# Patient Record
Sex: Male | Born: 1960 | Hispanic: Yes | Marital: Single | Smoking: Former smoker
Health system: Southern US, Community
[De-identification: ages and names within clinical notes are randomized; demographics above are authoritative.]

## PROBLEM LIST (undated history)

## (undated) DIAGNOSIS — I639 Cerebral infarction, unspecified: Secondary | ICD-10-CM

## (undated) DIAGNOSIS — E878 Other disorders of electrolyte and fluid balance, not elsewhere classified: Secondary | ICD-10-CM

## (undated) DIAGNOSIS — E119 Type 2 diabetes mellitus without complications: Secondary | ICD-10-CM

## (undated) DIAGNOSIS — I10 Essential (primary) hypertension: Secondary | ICD-10-CM

---

## 2006-06-07 ENCOUNTER — Emergency Department: Payer: Self-pay | Admitting: Emergency Medicine

## 2006-06-09 ENCOUNTER — Emergency Department: Payer: Self-pay | Admitting: Emergency Medicine

## 2012-04-07 ENCOUNTER — Inpatient Hospital Stay: Payer: Self-pay | Admitting: Specialist

## 2012-04-07 LAB — CBC
HGB: 14.8 g/dL (ref 13.0–18.0)
MCHC: 33.7 g/dL (ref 32.0–36.0)
MCV: 89 fL (ref 80–100)
Platelet: 179 10*3/uL (ref 150–440)
RBC: 4.96 10*6/uL (ref 4.40–5.90)
WBC: 16 10*3/uL — ABNORMAL HIGH (ref 3.8–10.6)

## 2012-04-07 LAB — LIPASE, BLOOD: Lipase: 3895 U/L — ABNORMAL HIGH (ref 73–393)

## 2012-04-07 LAB — COMPREHENSIVE METABOLIC PANEL
Albumin: 3 g/dL — ABNORMAL LOW (ref 3.4–5.0)
Anion Gap: 17 — ABNORMAL HIGH (ref 7–16)
BUN: 12 mg/dL (ref 7–18)
Calcium, Total: 7.4 mg/dL — ABNORMAL LOW (ref 8.5–10.1)
Chloride: 93 mmol/L — ABNORMAL LOW (ref 98–107)
Creatinine: 1.14 mg/dL (ref 0.60–1.30)
Osmolality: 268 (ref 275–301)
Potassium: 3.2 mmol/L — ABNORMAL LOW (ref 3.5–5.1)
SGPT (ALT): 262 U/L — ABNORMAL HIGH
Sodium: 129 mmol/L — ABNORMAL LOW (ref 136–145)

## 2012-04-07 LAB — APTT: Activated PTT: 23.1 secs — ABNORMAL LOW (ref 23.6–35.9)

## 2012-04-07 LAB — CK TOTAL AND CKMB (NOT AT ARMC)
CK, Total: 69 U/L (ref 35–232)
CK, Total: 61 U/L (ref 35–232)

## 2012-04-07 LAB — TROPONIN I: Troponin-I: 0.03 ng/mL

## 2012-04-08 LAB — CBC WITH DIFFERENTIAL/PLATELET
Eosinophil %: 0.6 %
HCT: 41.7 % (ref 40.0–52.0)
HGB: 14 g/dL (ref 13.0–18.0)
Lymphocyte #: 1.8 10*3/uL (ref 1.0–3.6)
Lymphocyte %: 10.2 %
MCHC: 33.5 g/dL (ref 32.0–36.0)
Monocyte #: 1.4 x10 3/mm — ABNORMAL HIGH (ref 0.2–1.0)
Neutrophil #: 14 10*3/uL — ABNORMAL HIGH (ref 1.4–6.5)
Neutrophil %: 80.6 %
WBC: 17.4 10*3/uL — ABNORMAL HIGH (ref 3.8–10.6)

## 2012-04-08 LAB — BASIC METABOLIC PANEL
Anion Gap: 10 (ref 7–16)
BUN: 9 mg/dL (ref 7–18)
Calcium, Total: 7.5 mg/dL — ABNORMAL LOW (ref 8.5–10.1)
Chloride: 91 mmol/L — ABNORMAL LOW (ref 98–107)
Co2: 27 mmol/L (ref 21–32)
Creatinine: 1.12 mg/dL (ref 0.60–1.30)
Glucose: 229 mg/dL — ABNORMAL HIGH (ref 65–99)
Osmolality: 263 (ref 275–301)
Potassium: 3 mmol/L — ABNORMAL LOW (ref 3.5–5.1)
Sodium: 128 mmol/L — ABNORMAL LOW (ref 136–145)

## 2012-04-08 LAB — LIPID PANEL
Cholesterol: 213 mg/dL — ABNORMAL HIGH (ref 0–200)
HDL Cholesterol: 12 mg/dL — ABNORMAL LOW (ref 40–60)

## 2012-04-08 LAB — HEMOGLOBIN A1C: Hemoglobin A1C: 8.7 % — ABNORMAL HIGH (ref 4.2–6.3)

## 2012-04-09 LAB — COMPREHENSIVE METABOLIC PANEL
Anion Gap: 12 (ref 7–16)
Calcium, Total: 8.2 mg/dL — ABNORMAL LOW (ref 8.5–10.1)
Co2: 23 mmol/L (ref 21–32)
EGFR (African American): >60
EGFR (Non-African Amer.): >60
Osmolality: 264 (ref 275–301)
SGOT(AST): 38 U/L — ABNORMAL HIGH (ref 15–37)
SGPT (ALT): 98 U/L — ABNORMAL HIGH
Sodium: 130 mmol/L — ABNORMAL LOW (ref 136–145)
Total Protein: 7.5 g/dL (ref 6.4–8.2)

## 2012-04-10 LAB — LIPASE, BLOOD: Lipase: 389 U/L (ref 73–393)

## 2013-12-19 IMAGING — CR DG CHEST 1V PORT
1 series · 1 of 1 positions shown · non-contrast
Comparison: none

REASON FOR EXAM: Chest Pain
COMMENTS:

PROCEDURE:     DXR - DXR PORTABLE CHEST SINGLE VIEW  - April 07, 2012  [DATE]
RESULT:     The lung fields are clear. The heart, mediastinal and osseous
structures show no significant abnormalities. Monitoring electrodes are
present.

[portable]
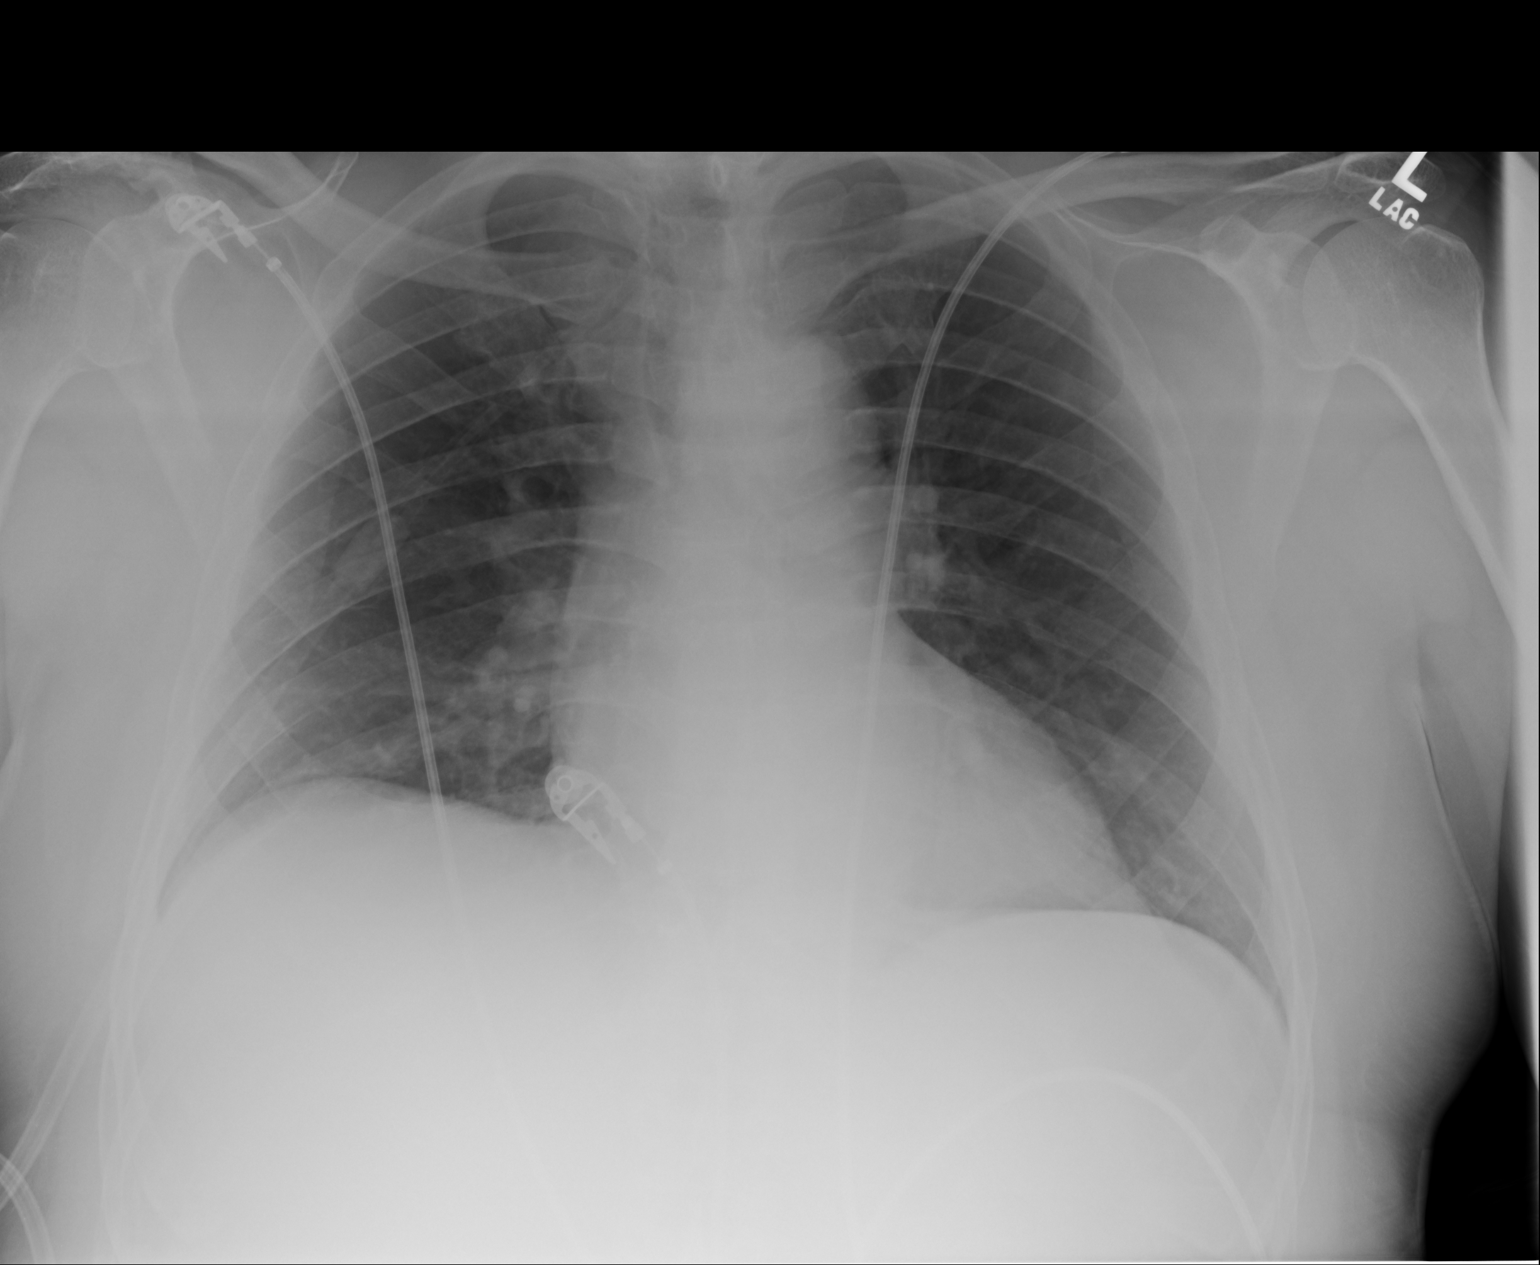

[1 of 1 positions shown; findings below may reference images not displayed]

IMPRESSION: 1.     No acute changes are identified.

[REDACTED]

## 2013-12-19 IMAGING — CT CT CHEST-ABD-PELV W/ CM
1 of 4 series · 11 of 34 positions shown, 17 images · IV contrast (APPLIED)
Comparison: None

REASON FOR EXAM: (1) chest paiin very hypertensive eval for dissection;
(2) left abd pain  IV  co
COMMENTS:

PROCEDURE:     CT  - CT CHEST ABDOMEN AND PELVIS W  - April 07, 2012  [DATE]
RESULT:     CT CHEST, ABDOMEN, AND PELVIS
TECHNIQUE: Multiple cardiac gated thin-section axial images obtained from
the thoracic inlet to the pubic symphysis, without p.o. contrast and with
100 ml of [id number] intravenous contrast. 3-D reconstructions and MIP
images were performed on a Siemens workstation.

[Series 6: soft tissue · axial · 0.75mm/px · z∈[-106,+218]mm · 11 of 130 slices shown, 17 images]
[im 11/130  mediastinal]
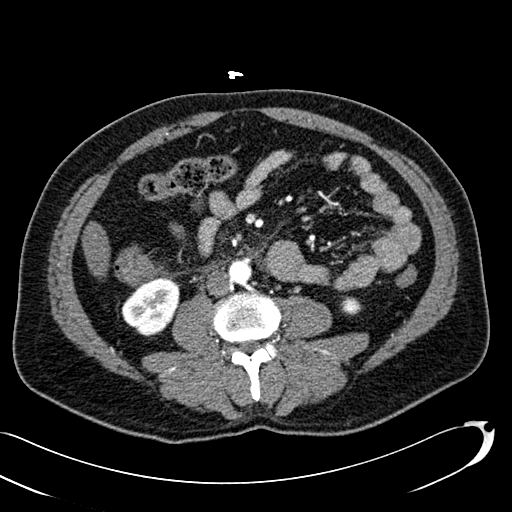
[im 11/130  bone]
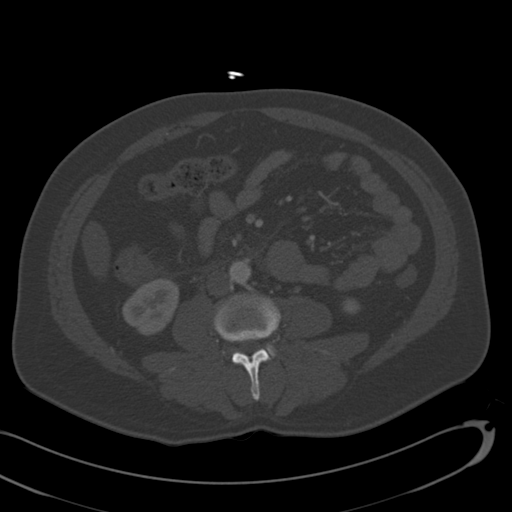
[im 22/130  mediastinal]
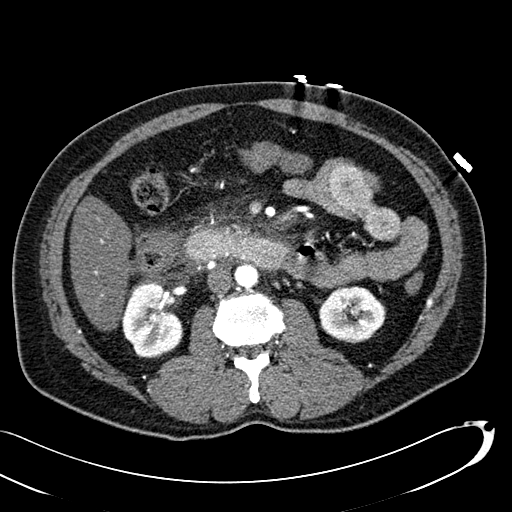
[im 33/130  mediastinal]
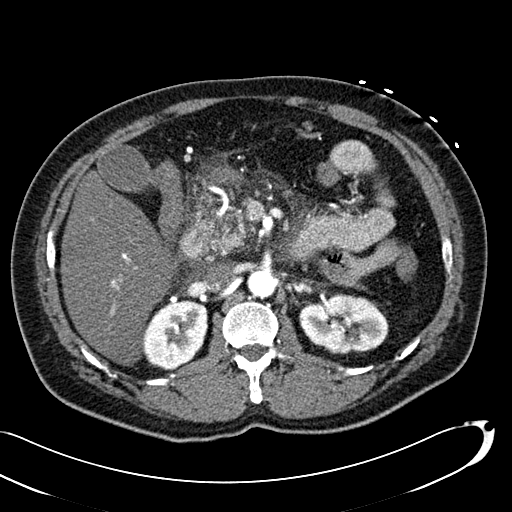
[im 44/130  mediastinal]
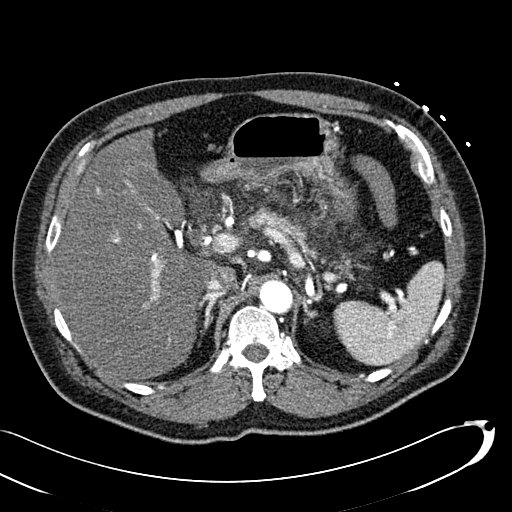
[im 54/130  mediastinal]
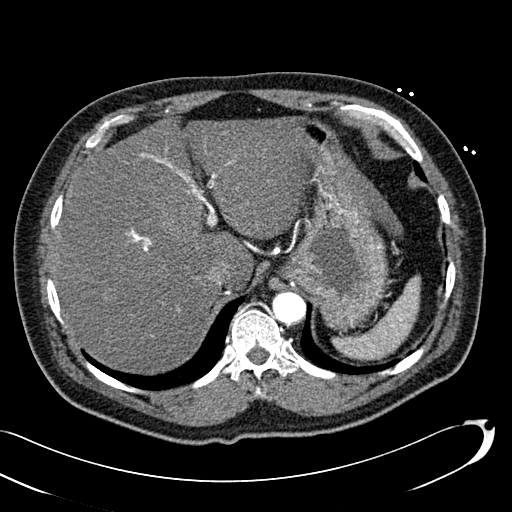
[im 65/130  mediastinal]
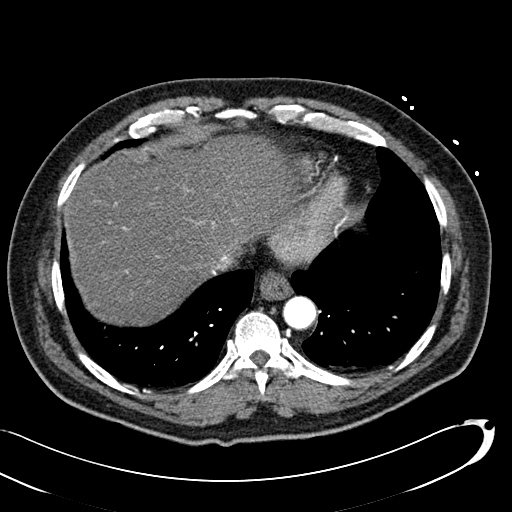
[im 76/130  mediastinal]
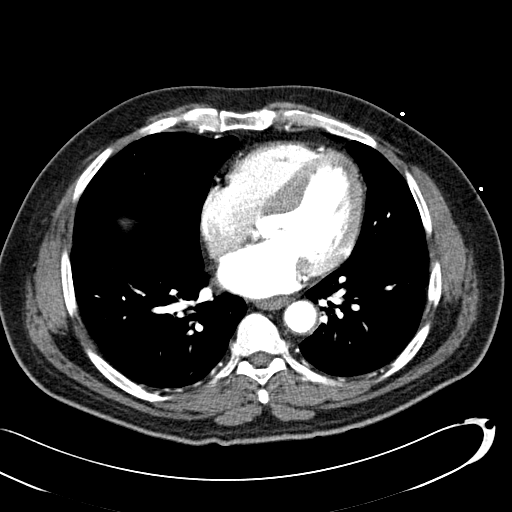
[im 87/130  mediastinal]
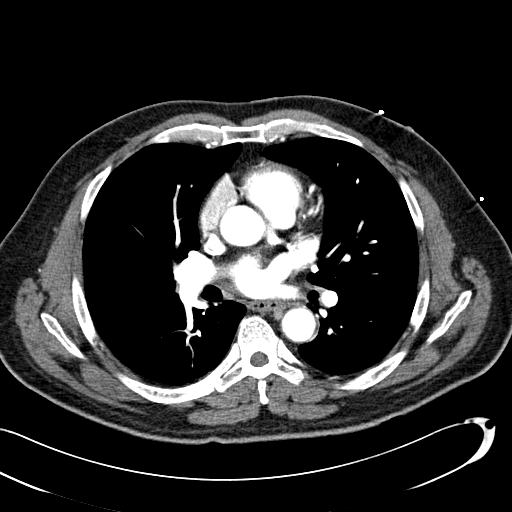
[im 87/130  lung]
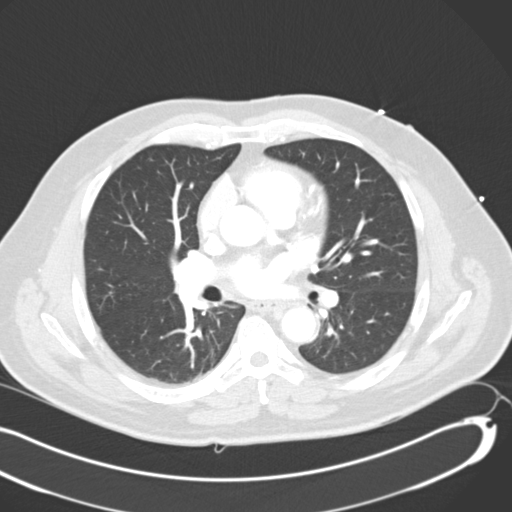
[im 97/130  mediastinal]
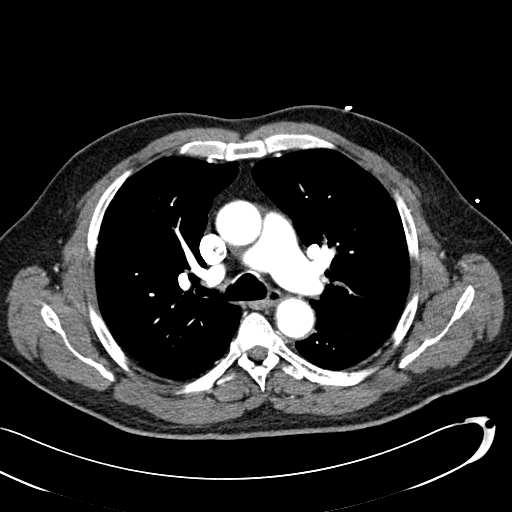
[im 97/130  lung]
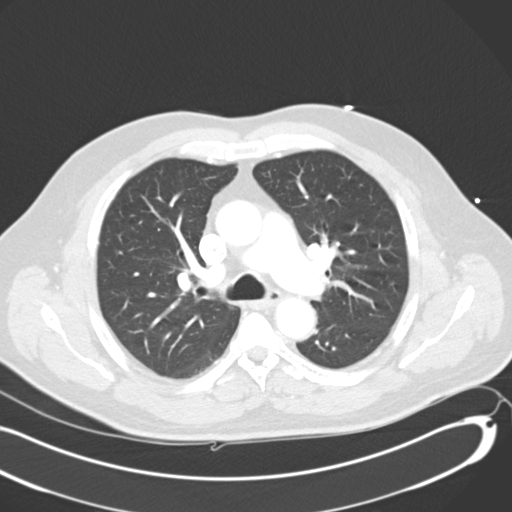
[im 97/130  bone]
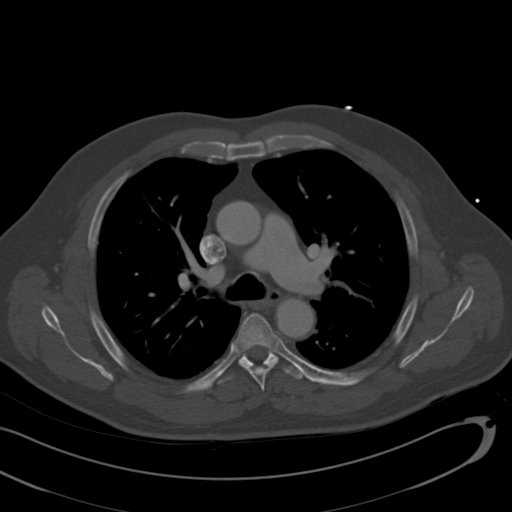
[im 108/130  mediastinal]
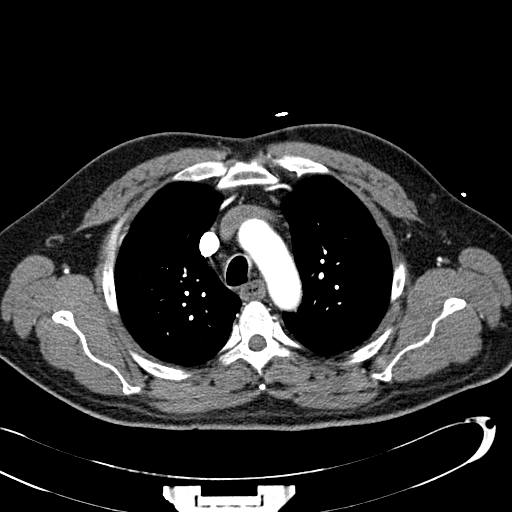
[im 108/130  lung]
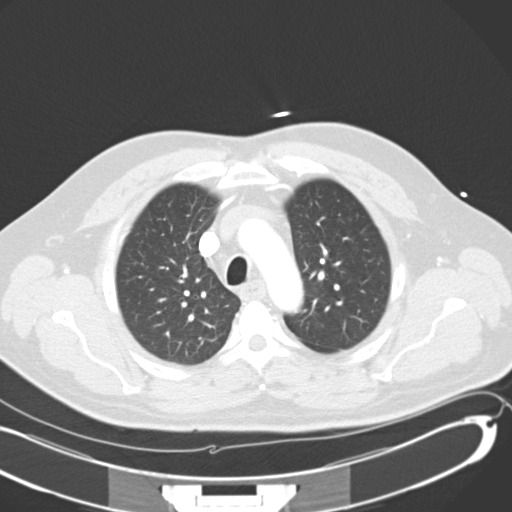
[im 119/130  mediastinal]
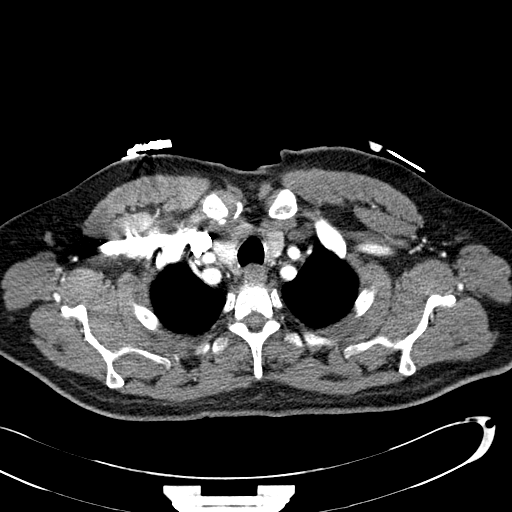
[im 119/130  lung]
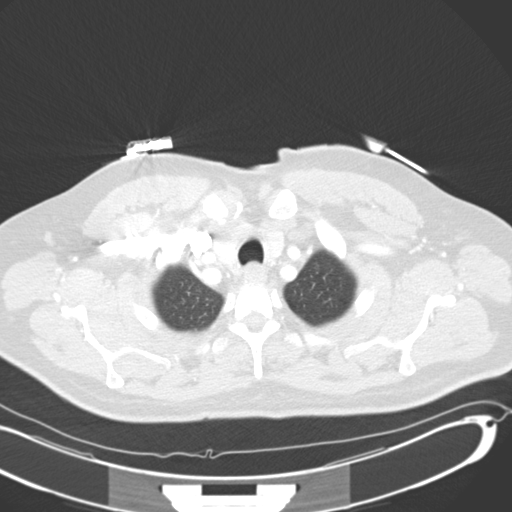

[11 of 34 positions shown; findings below may reference images not displayed]

FINDINGS: CHEST:

The lungs are clear. There is no focal mass. There is no focal parenchymal
opacity, pleural effusion, or pneumothorax.

The heart size is normal. There is no pericardial effusion. There is no
aortic dissection. The thoracic aorta is normal in caliber. There is no
thoracic aortic aneurysm. The superior mesenteric artery, celiac artery,
inferior mesenteric artery and bilateral renal arteries are normal.

There are no pathologically enlarged mediastinal, hilar, or axillary lymph
nodes.

The osseous structures demonstrate no focal abnormality.

ABDOMEN/PELVIS:

The liver is diffusely low in attenuation likely secondary to hepatic
steatosis. There is no intrahepatic or extrahepatic biliary ductal
dilatation. The gallbladder is unremarkable. There is an indeterminate
cm hypodense irregularly splenic mass which may represent a hemangioma. The
kidneys and adrenal glands are normal. The bladder is unremarkable. There
are peripancreatic inflammatory changes with haziness throughout the
mesentery. There is mild bowel wall thickening involving the distal
ascending colon likely reactive given its proximity to the peripancreatic
inflammatory change.

The unopacified stomach, duodenum, small intestine, and large intestine
demonstrate no gross abnormality, but evaluation is limited secondary to
lack of enteric contrast. There is no pneumoperitoneum, pneumatosis, or
portal venous gas. There is no abdominal or pelvic free fluid. There is no
lymphadenopathy.

The abdominal aorta is normal in caliber.

The osseous structures are unremarkable.
IMPRESSION: 1. Peripancreatic inflammatory changes most concerning for acute
pancreatitis. The pancreas enhances normally without areas of necrosis.
There is no focal fluid collection to suggest an abscess or pseudocyst.
Correlate with lipase levels.

2. Indeterminate splenic mass measuring 2.1 cm.

3. Hepatic steatosis.

[REDACTED]

## 2015-02-11 NOTE — Discharge Summary (Signed)
PATIENT NAME:  Robert Evans, Robert Evans MR#:  [id number] DATE OF BIRTH:  Feb 14, 1961  DATE OF ADMISSION:  04/07/2012 DATE OF DISCHARGE:  04/10/2012  For a detailed note, please take a look at the history and physical done on admission by Dr. Auburn Bilberry.    DIAGNOSES AT DISCHARGE:  1. Acute pancreatitis likely secondary to alcoholic binge.  2. Diabetes.  3. Hypertension.  4. Hyperlipidemia.   DIET: Patient is being discharged on a low sodium, low fat American Diabetic Association diet.   ACTIVITY: As tolerated.   FOLLOW UP: Follow up with [facility] in the next 1 to 2 weeks.   DISCHARGE MEDICATIONS: 1. Coreg 3.125 mg b.i.d.  2. Lisinopril 20 mg daily.  3. Pravachol 80 mg at bedtime. 4. Glipizide 10 mg in the morning and 5 mg in the evening. 5. Zestoretic 12.5/20, 1 tab daily.  6. Metformin 1000 mg b.i.d.  7. Trilipix 135 mg daily.  8. Vitamin D3 50,000 international units weekly.  9. Aspirin 325 mg daily.    LABORATORY, DIAGNOSTIC AND RADIOLOGICAL DATA: A chest x-ray done on admission showing no acute cardiopulmonary disease. A CT scan of the chest, abdomen and pelvis done showing peripancreatic inflammatory changes concerning for acute pancreatitis. No evidence of pancreatic necrosis. No pseudocyst. Hepatic steatosis.   HOSPITAL COURSE: This is a 54 year old male with medical problems as mentioned above presented to the hospital with abdominal pain, nausea, vomiting, and noted to have elevated lipase consistent with acute pancreatitis.  1. Acute pancreatitis. This was likely secondary to a recent alcoholic binge. Patient was also noted to have severe hyperlipidemia which also contributed to the patient's pancreatitis. Patient's CT of the abdomen showed pancreatitis but no evidence of any pancreatic necrosis or pseudocyst formation. Patient was treated supportively with IV fluids, antiemetics. Over the next few days patient's lipase has trended down. He has been able to tolerate now a  liquid diet and eventually advanced to a low fat diet without any exacerbations of his symptoms. He is therefore being discharged home and strongly advised to abstain from alcohol. He did not have any evidence of biliary pathology of his pancreatitis.  2. Uncontrolled hypertension. Some of this was contributed from the fact that patient had pancreatitis and also pain. Although patient's blood pressure improved after resumption of his home medications he was currently being discharged back on his Coreg, Zestoretic presently.  3. Chest pain. This was atypical chest pain. Most of this was related to his pancreatitis. He did have three sets of cardiac markers checked which were negative. There was no evidence of any acute coronary syndrome while he was here in the hospital.  4. Diabetes. Patient while in the hospital was maintained on sliding scale insulin coverage but he is being discharged back on his metformin and glipizide upon discharge.  5. Hyperlipidemia. Patient was maintained on his Pravachol and Trilipix which he will resume upon discharge.  6. Hypokalemia. This was secondary to his nausea, vomiting complicated with use of hydrochlorothiazide. This was supplemented and since then has resolved.  7. Hyponatremia also secondary to the diarrhea, nausea, vomiting. It has since then been supplemented and improved.  8. Patient clinically is doing much better therefore is being discharged home with close follow up with his primary care physician as an outpatient.  9. CODE STATUS: Patient is a FULL CODE.   TIME SPENT: 40 minutes.  ____________________________ Rolly Pancake. Cherlynn Kaiser, MD vjs:cms D: 04/10/2012 13:37:14 ET T: 04/10/2012 13:57:28 ET  JOB#: [id number] cc: Rolly Pancake.  Cherlynn KaiserSainani, MD, <Dictator> [facility] SirenVIVEK J Yovana Scogin MD ELECTRONICALLY SIGNED 04/12/2012 15:07

## 2015-02-11 NOTE — H&P (Signed)
PATIENT NAME:  Robert Evans, Edy MR#:  [id number] DATE OF BIRTH:  06-Oct-1961  DATE OF ADMISSION:  04/07/2012  PRIMARY CARE PHYSICIAN: Darreld McleanLinda Miles, MD, [facility]   CHIEF COMPLAINT: Chest pressure, abdominal pain, accelerated hypertension.   HISTORY OF PRESENT ILLNESS: The patient is a 54 year old Spanish male who has history of hypertension, diabetes type II, and hyperlipidemia who presented to the [facility] with complaint of having lower back and abdominal pain which started two days ago. The patient reports that the pain is sharp in nature, worse intermittently, but mostly constant. He has not ever had this type of pain. He does report that over the weekend he was drinking heavily. He drank throughout the weekend. Denies previous history of similar type of pain. He otherwise also was feeling nauseous and threw up a few times. He has not had any fevers or chills. The patient also started having chest pressure on and off for the past two days. He states that this was coming and going. When he presented to his primary care's office, he was noted to have a blood pressure of 216/113. He received a few doses of hydralazine here and his blood pressure is better. Also, his chest pressure is improved. He denies ever having chest pressure prior to this. Denies dyspnea on exertion. He occasionally does get short of breath. He denies any nocturnal dyspnea or orthopnea. Denies any lower extremity swelling.   PAST MEDICAL HISTORY:  1. Diabetes, type II. 2. Hypertension. 3. Hyperlipidemia. 4. History of genital herpes.   PAST SURGICAL HISTORY: None.   ALLERGIES: None.   CURRENT MEDICATIONS AT HOME:  1. Coreg 3.125 1 tab p.o. b.i.d.  2. Glipizide 10 mg 1 tab in the morning and a half tab in the evening. 3. Hydrochlorothiazide/lisinopril 12.5/20 one tab p.o. daily. Also, the patient takes lisinopril by itself at nighttime 20 mg.  4. Metformin 1000 mg 1 tab p.o. b.i.d.  5. Pravastatin 80  mg at bedtime.    6. Trilipix 135, 1 tab p.o. at bedtime.  7. Vitamin D3 50,000 IU 1 capsule monthly.   SOCIAL HISTORY: Does not smoke. Does drink. He reports that usually he binge drinks. Does not drink on a daily basis, only on weekends every two weekends according to him. Denies any drug use.   FAMILY HISTORY: Father died of complications of diabetes. He is not aware of any cardiac problems in the family.   REVIEW OF SYSTEMS: CONSTITUTIONAL: Denies any fevers. Complains of fatigue, weakness. Complains of chills. Does have abdominal pain as above. No weight loss. No weight gain. EYES: No blurred or double vision. No pain. No redness. No inflammation. No glaucoma. No cataracts. ENT: No tinnitus. No ear pain. No hearing loss. No seasonal or year round allergies. No difficulty swallowing. RESPIRATORY: No cough. No wheezing. No hemoptysis. No COPD. No TB. CARDIOVASCULAR: Chest pressure as above. Denies orthopnea, edema, or arrhythmia. Does complain of some dyspnea on exertion. No palpitations. No syncope. GI: Complains of some nausea. Also had some vomiting but no diarrhea. Has epigastric pain as above. No hematemesis. No melena. No gastroesophageal reflux disease. No IBS. No jaundice. No rectal bleeding. GU: Denies any dysuria, hematuria, renal colic, frequency. ENDOCRINE: Denies any polyuria, nocturia, or thyroid problems. HEME/LYMPH: Denies anemia, easy bruisability, or bleeding. SKIN: No acne. No rash. No changes in mole, hair or skin. MUSCULOSKELETAL: No pain in neck, back, or shoulder. NEUROLOGIC: No numbness. No CVA. No TIA. PSYCHIATRIC: No anxiety. No insomnia. No ADD. No  OCD. No bipolar.   PHYSICAL EXAMINATION:   VITAL SIGNS: Temperature 97, pulse 96, respirations 18, blood pressure initially 199/111, currently is 167/87.   GENERAL: The patient is a mildly obese Spanish male currently not in any acute distress.   HEENT: Head atraumatic, normocephalic. Pupils equal, round, reactive to light and  accommodation. There is no conjunctival pallor. No scleral icterus. Nasal exam shows no drainage or ulceration. Oropharynx is clear without any exudates. External ear exam shows no drainage.   NECK: No thyromegaly. No carotid bruits.   CARDIOVASCULAR: Regular rate and rhythm. No murmurs, rubs, clicks, or gallops. PMI is not displaced.   LUNGS: Clear to auscultation bilaterally without any rales, rhonchi, or wheezing.   ABDOMEN: There is epigastric tenderness without any guarding or rebound. Positive bowel sounds x4. There is no hepatosplenomegaly.   SKIN: No rash.   LYMPHATICS: No lymph nodes palpable.   VASCULAR: Good DP, PT pulses.   PSYCHIATRIC: Not anxious or depressed.   MUSCULOSKELETAL: There is no erythema or swelling. Good range of motion.   SKIN: No rash.   PSYCHIATRIC: Not anxious or depressed.   NEUROLOGICAL: Awake, alert, oriented x3. No focal deficits.   EVALUATIONS IN THE ED: Glucose 271, BUN 12, creatinine 1.14, sodium 129, potassium 3.2, chloride 93, CO2 19. Anion gap was slightly elevated at 17. Lipase was 3895. LFTs total protein 7.1, albumin 3.0, bilirubin total 1.3, alkaline phosphatase 109, AST 212, ALT 262. CPK 69. CK-MB 0.7. Troponin less than 0.02. WBC count 16.0, hemoglobin 14.8, platelet count 179. INR 0.8.   CT scan of the chest, abdomen, and pelvis done by the ED physician shows peripancreatic inflammatory changes. There is an indeterminate splenic mass measuring 2.1 cm. Hepatic steatosis.   EKG showed normal sinus rhythm with possible old infarct.   ASSESSMENT AND PLAN: The patient is a 54 year old Spanish male who presents with having chest pressure, abdominal pain, as well as accelerated hypertension.  1. Chest pressure in the setting of accelerated hypertension. At this time will check serial cardiac enzymes. Will treat his blood pressure aggressively. Once he is improved from his pancreatitis, consider in or outpatient cardiac stress test unless  obviously his cardiac enzymes are elevated and the patient continues to have symptoms.  2. Accelerated hypertension. Will continue nitro patch as written in the ED. I will use hydralazine IV p.r.n. and clonidine patch as well. His blood pressure also could have been very elevated due to his pain level. Will adjust his regimen as needed. Once he is able to take p.o., then we can restart all of his home medications and adjust his home treatment.  3. Acute pancreatitis likely due to binge drinking that the patient did over the weekend. Also, he is on HCTZ which can also cause pancreatitis. Will hold HCTZ. Will also check a fasting lipid panel and make sure he does not have severe hypertriglyceridemia. CT of the abdomen shows pancreatitis. No biliary duct dilation.  4. Hyponatremia likely due to vomiting as well as excessive alcohol ingestion and dehydration. Will give him IV fluids.  5. Hypokalemia likely due to combination of HCTZ and loss through his emesis. Will give him potassium supplement.  6. Slightly elevated anion gap likely in the setting of poor p.o. intake, acute pancreatitis. The patient currently does not appear toxic. I will check a lactic acid level. Follow his anion gap in the morning after IV hydration.  7. Leukocytosis likely due to acute pancreatitis. Will follow WBC in the morning. Follow his  temperature. Hold antibiotics for the time being. There is no evidence of any pancreatic necrosis.  8. Diabetes. Currently blood sugars are elevated again likely in setting of acute pancreatitis and the patient not being able to take his medications. I will place him on higher dose sliding scale insulin. Will check a hemoglobin A1c.  9. Splenic mass of unclear cause. At this time will monitor. Will need follow-up imaging as outpatient. Also may need outpatient Oncology evaluation.   TIME SPENT: 35 minutes spent.    ____________________________ Lacie Scotts. Allena Katz, MD shp:drc D: 04/07/2012 17:00:01  ET T: 04/07/2012 17:17:47 ET JOB#: [id number]  cc: Mamoru Takeshita H. Allena Katz, MD, <Dictator> Leanna Sato, MD Charise Carwin MD ELECTRONICALLY SIGNED 04/12/2012 14:51

## 2017-11-04 ENCOUNTER — Encounter: Payer: Self-pay | Admitting: Emergency Medicine

## 2017-11-04 ENCOUNTER — Other Ambulatory Visit: Payer: Self-pay

## 2017-11-04 ENCOUNTER — Emergency Department
Admission: EM | Admit: 2017-11-04 | Discharge: 2017-11-04 | Disposition: A | Discharge status: Home / Self Care | Payer: Self-pay | Attending: Emergency Medicine | Admitting: Emergency Medicine

## 2017-11-04 DIAGNOSIS — Z87891 Personal history of nicotine dependence: Secondary | ICD-10-CM | POA: Insufficient documentation

## 2017-11-04 DIAGNOSIS — E119 Type 2 diabetes mellitus without complications: Secondary | ICD-10-CM | POA: Insufficient documentation

## 2017-11-04 DIAGNOSIS — I10 Essential (primary) hypertension: Secondary | ICD-10-CM | POA: Insufficient documentation

## 2017-11-04 DIAGNOSIS — H5789 Other specified disorders of eye and adnexa: Secondary | ICD-10-CM

## 2017-11-04 DIAGNOSIS — H11422 Conjunctival edema, left eye: Secondary | ICD-10-CM | POA: Insufficient documentation

## 2017-11-04 DIAGNOSIS — H18892 Other specified disorders of cornea, left eye: Secondary | ICD-10-CM | POA: Insufficient documentation

## 2017-11-04 DIAGNOSIS — H11001 Unspecified pterygium of right eye: Secondary | ICD-10-CM | POA: Insufficient documentation

## 2017-11-04 HISTORY — DX: Cerebral infarction, unspecified: I63.9

## 2017-11-04 HISTORY — DX: Other disorders of electrolyte and fluid balance, not elsewhere classified: E87.8

## 2017-11-04 HISTORY — DX: Essential (primary) hypertension: I10

## 2017-11-04 HISTORY — DX: Type 2 diabetes mellitus without complications: E11.9

## 2017-11-04 MED ORDER — TETRACAINE HCL 0.5 % OP SOLN
2.0000 [drp] | Freq: Once | OPHTHALMIC | Status: AC
  Administered 2017-11-04: 2 [drp] via OPHTHALMIC
  Filled 2017-11-04: qty 4

## 2017-11-04 MED ORDER — CIPROFLOXACIN HCL 0.3 % OP SOLN: 1.0000 [drp] | OPHTHALMIC | 0 refills | Status: DC

## 2017-11-04 MED ORDER — KETOROLAC TROMETHAMINE 0.4 % OP SOLN: 1.0000 [drp] | Freq: Four times a day (QID) | OPHTHALMIC | 0 refills | Status: AC

## 2017-11-04 MED ORDER — FLUORESCEIN SODIUM 1 MG OP STRP
1.0000 | ORAL_STRIP | Freq: Once | OPHTHALMIC | Status: AC
  Administered 2017-11-04: 1 via OPHTHALMIC
  Filled 2017-11-04: qty 1

## 2017-11-04 MED ORDER — CIPROFLOXACIN HCL 0.3 % OP SOLN: 1.0000 [drp] | OPHTHALMIC | 0 refills | Status: AC

## 2017-11-04 MED ORDER — CETIRIZINE HCL 10 MG PO TABS: 10.0000 mg | ORAL_TABLET | Freq: Every day | ORAL | 0 refills | Status: AC

## 2017-11-04 MED ORDER — ERYTHROMYCIN 5 MG/GM OP OINT: TOPICAL_OINTMENT | Freq: Three times a day (TID) | OPHTHALMIC | 0 refills | Status: AC

## 2017-11-04 NOTE — ED Provider Notes (Signed)
[facility] Emergency Department Provider Note  ____________________________________________  Time seen: Approximately 5:47 PM  I have reviewed the triage vital signs and the nursing notes.   HISTORY  Chief Complaint Eye Pain    HPI Robie Ridgeaulino Buren is a 57 y.o. male that presents to the emergency department for evaluation of bilateral eye irritation for 4 days. When reading small print, his vision is blurry. He does not have any difficulty with large print. The light was bothering his eyes this morning. He is not having any pain in his eye currently.  No trauma to eyes. No glasses or contacts.  He has been applying eye irritation drops without relief.  He saw the opthalmologist 1.5 months ago.  No history of allergies.  No headache, double vision, floaters, flashers, nausea, vomiting.    Past Medical History:  Diagnosis Date  . Diabetes (HCC)   . HTN (hypertension)   . Hyperchloremia   . Stroke (cerebrum) (HCC)     There are no active problems to display for this patient.   History reviewed. No pertinent surgical history.  Prior to Admission medications   Medication Sig Start Date End Date Taking? Authorizing Provider  cetirizine (ZYRTEC ALLERGY) 10 MG tablet Take 1 tablet (10 mg total) by mouth daily. 11/04/17   Enid DerryWagner, Makinzie Considine, PA-C  erythromycin [facility](ROMYCIN) ophthalmic ointment Place into the right eye 3 (three) times daily for 10 days. Place a 1/2 inch ribbon of ointment into the lower eyelid. 11/04/17 11/14/17  Enid DerryWagner, Jamell Opfer, PA-C  ketorolac (ACULAR) 0.4 % SOLN Place 1 drop into both eyes 4 (four) times daily for 4 days. 11/04/17 11/08/17  Enid DerryWagner, Ogden Handlin, PA-C    Allergies Patient has no known allergies.  History reviewed. No pertinent family history.  Social History Social History   Tobacco Use  . Smoking status: Former Smoker  Substance Use Topics  . Alcohol use: No    Frequency: Never  . Drug use: No     Review of Systems   Constitutional: No fever/chills Cardiovascular: No chest pain. Respiratory: No SOB. Gastrointestinal: No abdominal pain.  No nausea, no vomiting.  Skin: Negative for rash, abrasions, lacerations, ecchymosis. Neurological: Negative for headaches   ____________________________________________   PHYSICAL EXAM:  VITAL SIGNS: ED Triage Vitals  Enc Vitals Group     BP 11/04/17 1627 (!) 185/99     Pulse Rate 11/04/17 1627 81     Resp 11/04/17 1627 20     Temp 11/04/17 1627 97.6 F (36.4 C)     Temp Source 11/04/17 1627 Oral     SpO2 11/04/17 1627 98 %     Weight 11/04/17 1634 135 lb (61.2 kg)     Height 11/04/17 1634 5\' 1"  (1.549 m)     Head Circumference --      Peak Flow --      Pain Score --      Pain Loc --      Pain Edu? --      Excl. in GC? --      Constitutional: Alert and oriented. Well appearing and in no acute distress. Eyes: Left conjunctivae is mildly bulging, injected and appears gelatinous. No hypopyon or hyphema. Pterygium to right eye. PERRL. EOMI without pain. 1mm circular defect to 3pm position on fluorescence stain. Tonometer pressures 20-30. No visible swelling around eye. No erythema around eye. No drainage.  Head: Atraumatic. ENT:      Ears:      Nose: No congestion/rhinnorhea.  Mouth/Throat: Mucous membranes are moist.  Neck: No stridor.   Cardiovascular: Normal rate, regular rhythm.  Good peripheral circulation. Respiratory: Normal respiratory effort without tachypnea or retractions. Lungs CTAB. Good air entry to the bases with no decreased or absent breath sounds. Musculoskeletal: Full range of motion to all extremities. No gross deformities appreciated. Neurologic:  Normal speech and language. No gross focal neurologic deficits are appreciated.  Skin:  Skin is warm, dry and intact. No rash noted.   ____________________________________________   LABS (all labs ordered are listed, but only abnormal results are displayed)  Labs Reviewed - No  data to display ____________________________________________  EKG   ____________________________________________  RADIOLOGY   No results found.  ____________________________________________    PROCEDURES  Procedure(s) performed:    Procedures    Medications  fluorescein ophthalmic strip 1 strip (1 strip Left Eye Given 11/04/17 1804)  tetracaine (PONTOCAINE) 0.5 % ophthalmic solution 2 drop (2 drops Left Eye Given 11/04/17 1804)     ____________________________________________   INITIAL IMPRESSION / ASSESSMENT AND PLAN / ED COURSE  Pertinent labs & imaging results that were available during my care of the patient were reviewed by me and considered in my medical decision making (see chart for details).  Review of the  [place] was performed in accordance of the [place] prior to dispensing any controlled drugs.   Patient presented to the emergency department for evaluation of red and painful eye for 4 days.  Eye has appearance of consistent with chemosis. He has a small defect on fluorescence stain. Patient was discharged home with prescriptions for erythromycin ointment, ketorolac, cetirizine. Patient does not wear contacts but was called at 7 pm to change prescription of erythromycin ointment to ciprofloxacin drops to cover for psudomonas. Prescription was left with Annice Pih at the end of shift to call patient again and leave prescription with charge nurse. Patient is to follow up with opthamalogy tomorrow. No indication for hordeolum, dacrocystitis, cellulitis, subconjunctival hemorrhage, hyphema, hypopyon, retinal detachment, uveitis, glaucoma. Patient is given ED precautions to return to the ED for any worsening or new symptoms.    ____________________________________________  FINAL CLINICAL IMPRESSION(S) / ED DIAGNOSES  Final diagnoses:  Chemosis of left conjunctiva  Pterygium of right eye  Eye irritation      NEW MEDICATIONS STARTED DURING THIS VISIT:  ED  Discharge Orders        Ordered    erythromycin [facility]) ophthalmic ointment  3 times daily     11/04/17 1751    ketorolac (ACULAR) 0.4 % SOLN  4 times daily     11/04/17 1751    cetirizine (ZYRTEC ALLERGY) 10 MG tablet  Daily     11/04/17 1751          This chart was dictated using voice recognition software/Dragon. Despite best efforts to proofread, errors can occur which can change the meaning. Any change was purely unintentional.    Enid Derry, PA-C 11/04/17 1907    Pershing Proud Myra Rude, MD 11/04/17 [phone]

## 2017-11-04 NOTE — ED Triage Notes (Signed)
Pt reports that his left eye has been hurting, gradually getting more red and watery over the last 4 days. In the am when he awakes it is crusted shut. Lights also bother his eyes.

## 2017-11-04 NOTE — ED Notes (Signed)
Pt alert and oriented X4, active, cooperative, pt in NAD. RR even and unlabored, color WNL.  Pt informed to return if any life threatening symptoms occur.  Discharge and followup instructions reviewed.  Interpreter used for discharge.

## 2017-11-04 NOTE — ED Notes (Signed)
Fluorescein strip and tetracaine given by Enid DerryAshley Wagner, PA

## 2017-11-04 NOTE — ED Notes (Signed)
Interpreter requested 

## 2022-09-19 DEATH — deceased
